# Patient Record
Sex: Male | Born: 1955 | Race: White | Hispanic: No | Marital: Married | Smoking: Former smoker
Health system: Southern US, Community
[De-identification: ages and names within clinical notes are randomized; demographics above are authoritative.]

## PROBLEM LIST (undated history)

## (undated) DIAGNOSIS — Z789 Other specified health status: Secondary | ICD-10-CM

## (undated) HISTORY — PX: NO PAST SURGERIES: SHX2092

## (undated) HISTORY — PX: OTHER SURGICAL HISTORY: SHX169

---

## 2013-03-30 ENCOUNTER — Other Ambulatory Visit: Payer: Self-pay | Admitting: Family Medicine

## 2013-03-30 DIAGNOSIS — R7989 Other specified abnormal findings of blood chemistry: Secondary | ICD-10-CM

## 2013-03-31 ENCOUNTER — Ambulatory Visit
Admission: RE | Admit: 2013-03-31 | Discharge: 2013-03-31 | Disposition: A | Discharge status: Home / Self Care | Payer: PRIVATE HEALTH INSURANCE | Source: Ambulatory Visit | Attending: Family Medicine | Admitting: Family Medicine

## 2013-03-31 DIAGNOSIS — R7989 Other specified abnormal findings of blood chemistry: Secondary | ICD-10-CM

## 2013-12-12 IMAGING — US US ABDOMEN COMPLETE
1 series · 14 of 25 positions shown · non-contrast
Comparison: None.

CLINICAL DATA: Elevated ferritin level

COMPLETE ABDOMINAL ULTRASOUND

[Series 1: us abdomen complete · 0.33mm/px · 14 of 92 slices shown]
[im 1/92]
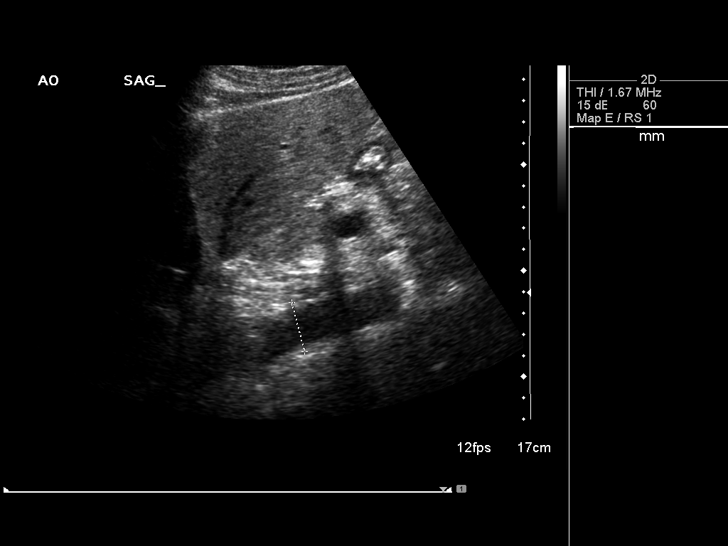
[im 8/92]
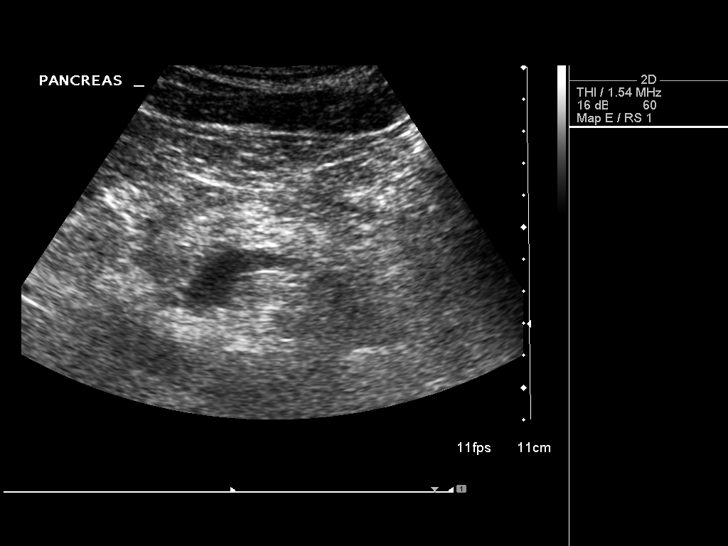
[im 16/92]
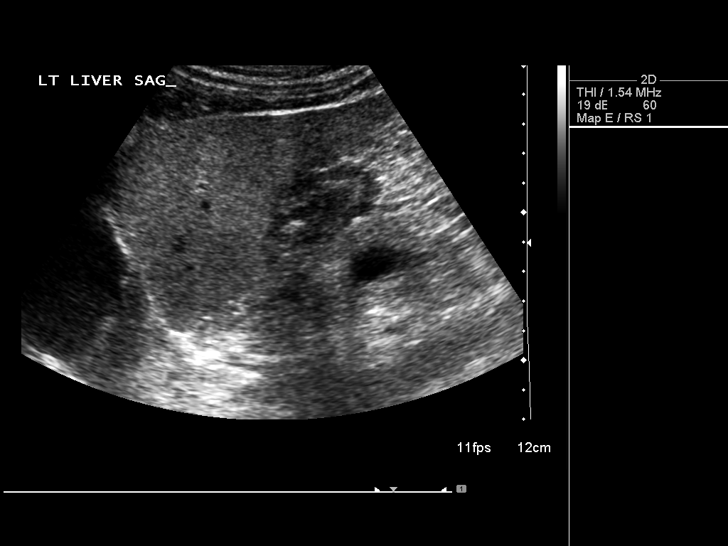
[im 23/92]
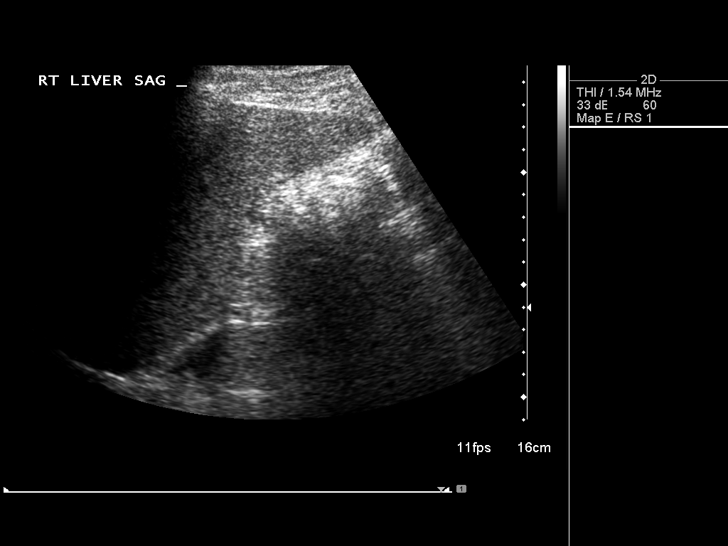
[im 31/92]
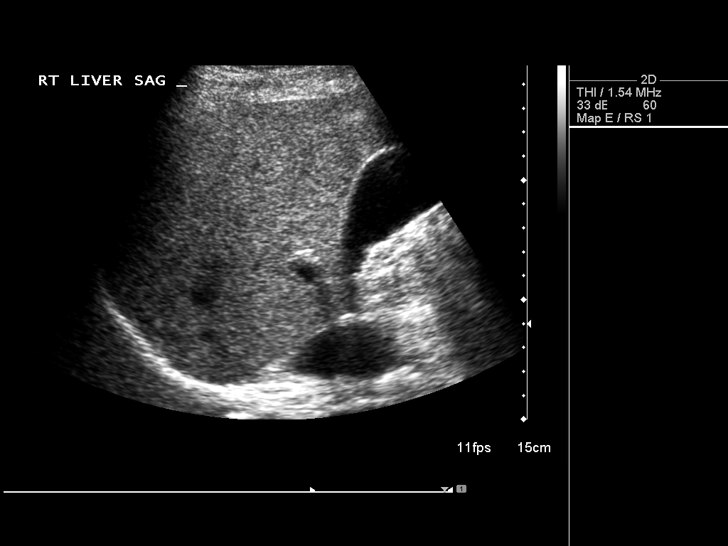
[im 35/92]
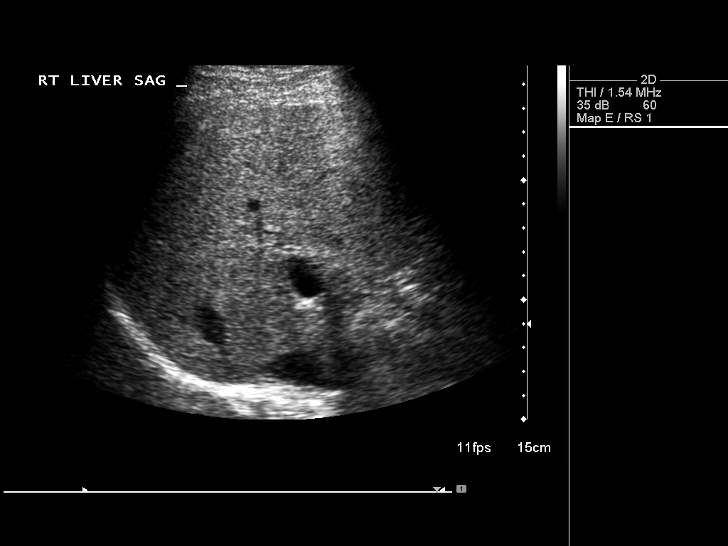
[im 42/92]
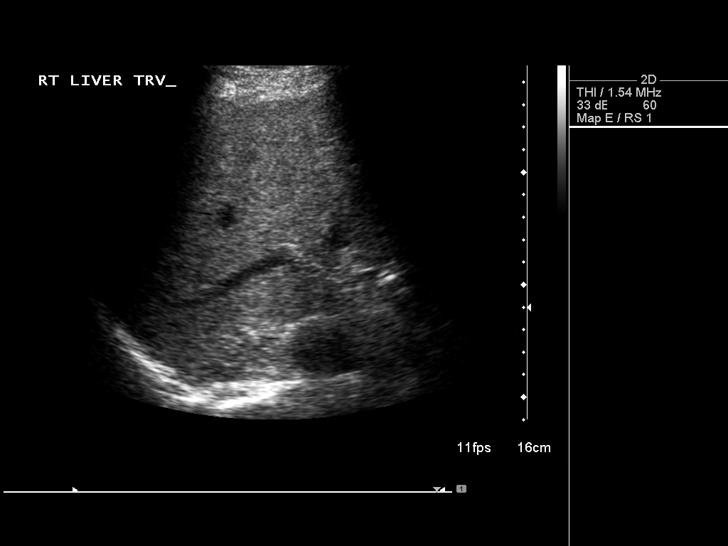
[im 50/92]
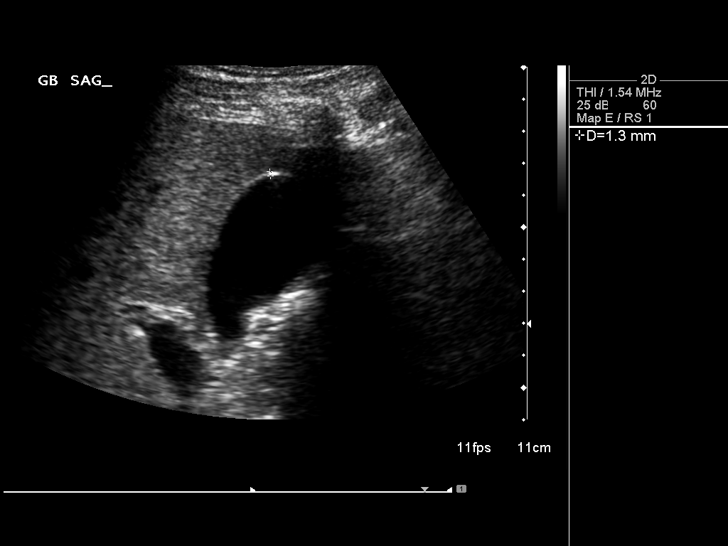
[im 57/92]
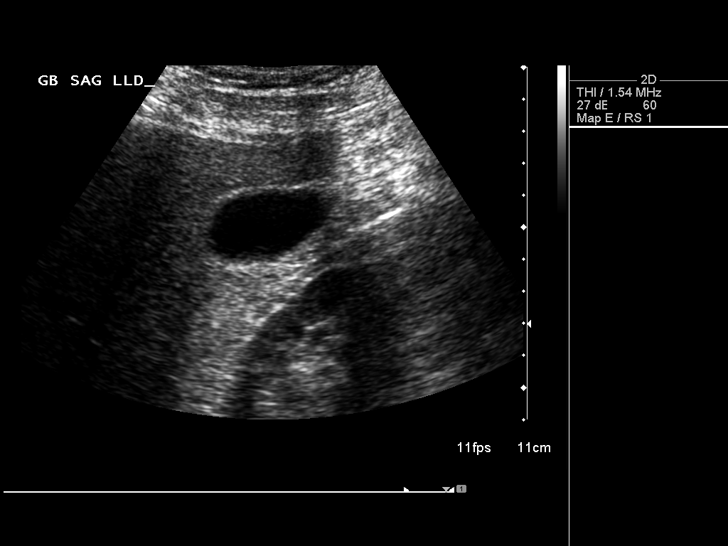
[im 61/92]
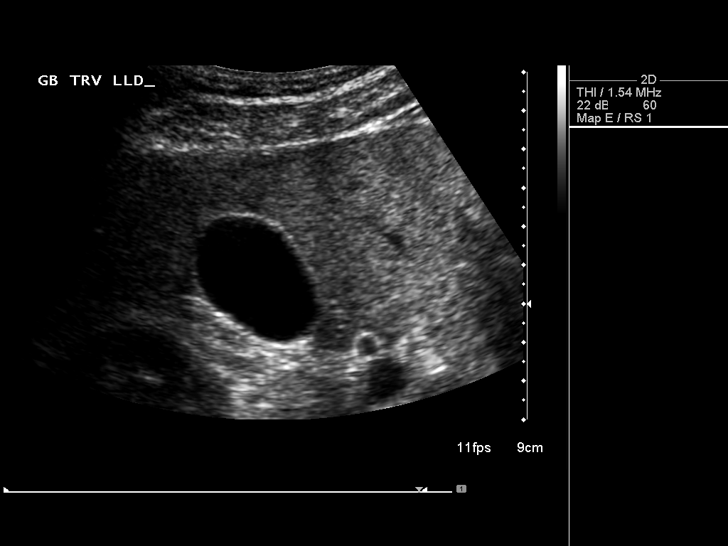
[im 69/92]
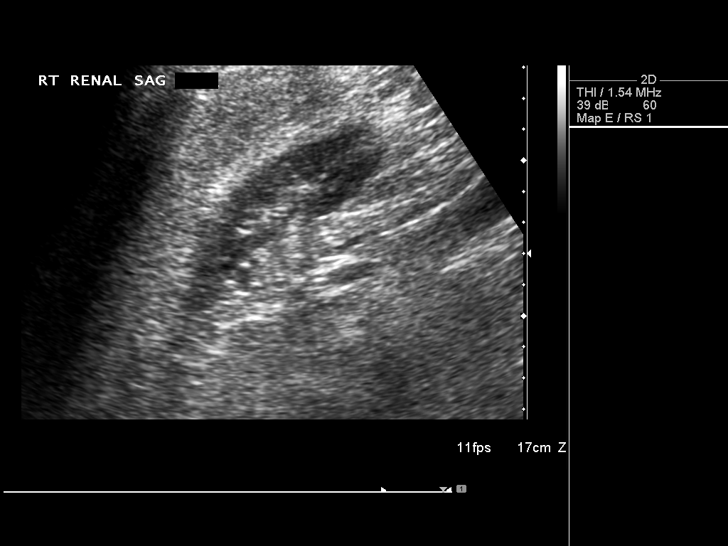
[im 76/92]
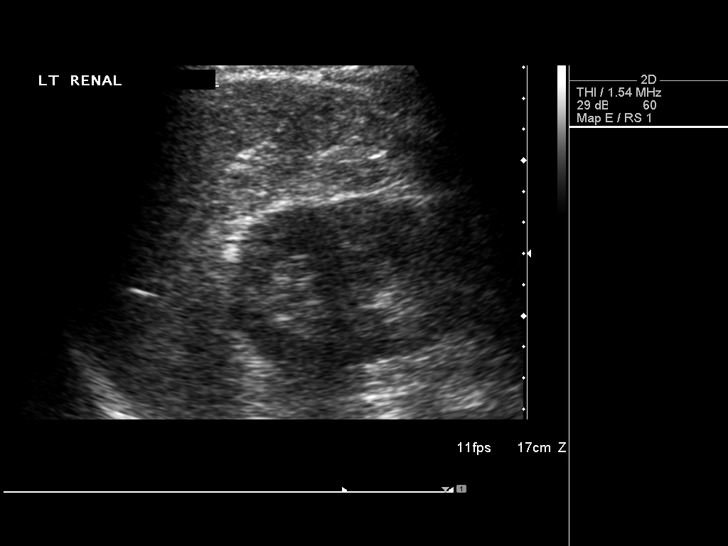
[im 84/92]
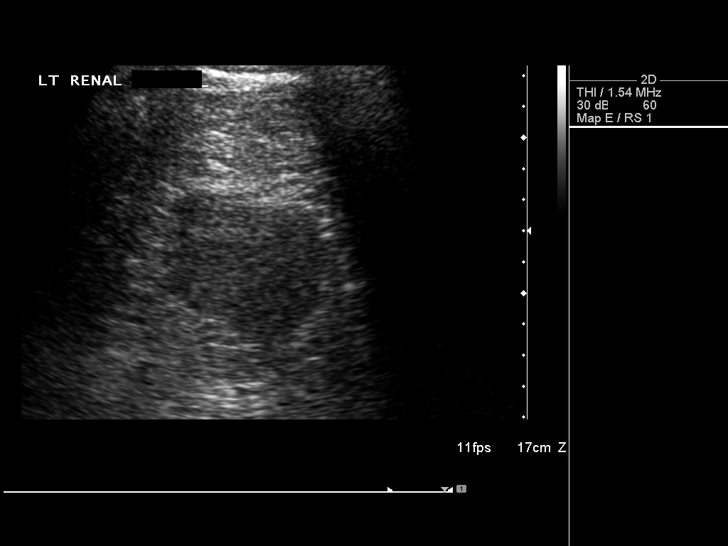
[im 92/92]
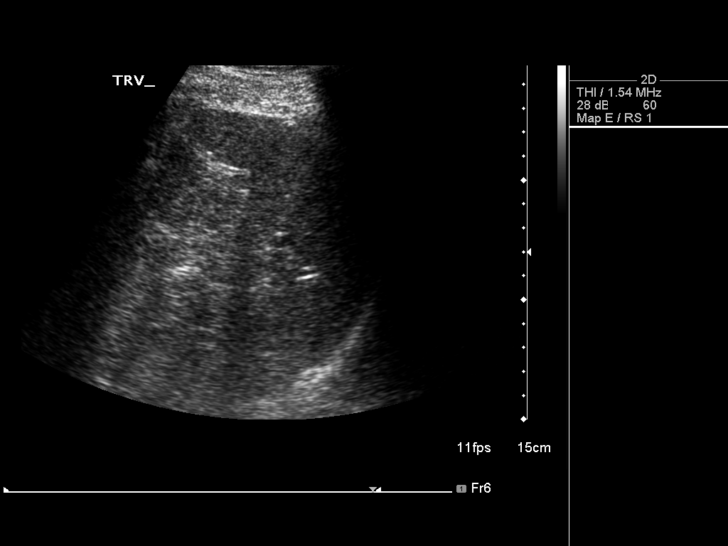

[14 of 25 positions shown; findings below may reference images not displayed]

FINDINGS: Gallbladder:  The gallbladder is visualized and no gallstones are
noted.  There is no pain over the gallbladder with compression.

Common bile duct:  The common bile duct is normal measuring 5.5 mm
in diameter.

Liver:  The liver may be slightly echogenic, which could indicate
fatty infiltration.  Correlation with liver function tests is
recommended.

IVC:  Appears normal.

Pancreas:  No focal abnormality seen.

Spleen:  The spleen is normal measuring 7.9 cm sagittally.

Right Kidney:  No hydronephrosis is seen.  The right kidney
measures 11.8 cm sagittally.

Left Kidney:  No hydronephrosis is noted.  The left kidney measures
12.0 cm.

Abdominal aorta:  The abdominal aorta is normal in caliber.
IMPRESSION: 1.  No gallstones.  No ductal dilatation.
2.  Somewhat echogenic liver parenchyma may indicate fatty
infiltration.  Correlate with liver function tests.

## 2016-10-03 ENCOUNTER — Other Ambulatory Visit: Payer: Self-pay | Admitting: Gastroenterology

## 2016-11-09 ENCOUNTER — Encounter (HOSPITAL_COMMUNITY): Payer: Self-pay | Admitting: *Deleted

## 2016-11-13 ENCOUNTER — Ambulatory Visit (HOSPITAL_COMMUNITY): Payer: BLUE CROSS/BLUE SHIELD | Admitting: Anesthesiology

## 2016-11-13 ENCOUNTER — Ambulatory Visit (HOSPITAL_COMMUNITY)
Admission: RE | Admit: 2016-11-13 | Discharge: 2016-11-13 | Disposition: A | Discharge status: Home / Self Care | Payer: BLUE CROSS/BLUE SHIELD | Source: Ambulatory Visit | Attending: Gastroenterology | Admitting: Gastroenterology

## 2016-11-13 ENCOUNTER — Encounter (HOSPITAL_COMMUNITY)
Admission: RE | Disposition: A | Discharge status: Home / Self Care | Payer: Self-pay | Source: Ambulatory Visit | Attending: Gastroenterology

## 2016-11-13 ENCOUNTER — Encounter (HOSPITAL_COMMUNITY): Payer: Self-pay

## 2016-11-13 DIAGNOSIS — Z1211 Encounter for screening for malignant neoplasm of colon: Secondary | ICD-10-CM | POA: Insufficient documentation

## 2016-11-13 DIAGNOSIS — E7800 Pure hypercholesterolemia, unspecified: Secondary | ICD-10-CM | POA: Diagnosis not present

## 2016-11-13 DIAGNOSIS — D122 Benign neoplasm of ascending colon: Secondary | ICD-10-CM | POA: Diagnosis not present

## 2016-11-13 DIAGNOSIS — Z87891 Personal history of nicotine dependence: Secondary | ICD-10-CM | POA: Insufficient documentation

## 2016-11-13 HISTORY — PX: COLONOSCOPY WITH PROPOFOL: SHX5780

## 2016-11-13 HISTORY — DX: Other specified health status: Z78.9

## 2016-11-13 SURGERY — COLONOSCOPY WITH PROPOFOL
Anesthesia: Monitor Anesthesia Care

## 2016-11-13 MED ORDER — PROPOFOL 10 MG/ML IV BOLUS
INTRAVENOUS | Status: AC
  Filled 2016-11-13: qty 40

## 2016-11-13 MED ORDER — PROPOFOL 500 MG/50ML IV EMUL
INTRAVENOUS | Status: DC | PRN
  Administered 2016-11-13: 150 ug/kg/min via INTRAVENOUS

## 2016-11-13 MED ORDER — LACTATED RINGERS IV SOLN
INTRAVENOUS | Status: DC
  Administered 2016-11-13: 500 mL via INTRAVENOUS

## 2016-11-13 MED ORDER — SODIUM CHLORIDE 0.9 % IV SOLN: INTRAVENOUS | Status: DC

## 2016-11-13 MED ORDER — PROPOFOL 10 MG/ML IV BOLUS
INTRAVENOUS | Status: AC
  Filled 2016-11-13: qty 20

## 2016-11-13 MED ORDER — LIDOCAINE 2% (20 MG/ML) 5 ML SYRINGE
INTRAMUSCULAR | Status: AC
  Filled 2016-11-13: qty 5

## 2016-11-13 MED ORDER — PROPOFOL 10 MG/ML IV BOLUS
INTRAVENOUS | Status: DC | PRN
  Administered 2016-11-13: 50 mg via INTRAVENOUS

## 2016-11-13 MED ORDER — LIDOCAINE 2% (20 MG/ML) 5 ML SYRINGE
INTRAMUSCULAR | Status: DC | PRN
  Administered 2016-11-13: 100 mg via INTRAVENOUS

## 2016-11-13 SURGICAL SUPPLY — 21 items

## 2016-11-13 NOTE — H&P (Signed)
Procedure: Screening colonoscopy. Normal screening colonoscopy was performed on 05/28/2006  History: The patient is a 61 year old male born October 31, 1955. He is scheduled to undergo a repeat screening colonoscopy today. There is no family history of colon cancer.  Past medical history: Hypercholesterolemia.  Exam: The patient is alert and lying comfortably on the endoscopy stretcher. Abdomen soft and nontender to palpation. Cardiac exam reveals a regular rhythm. Lungs appear to auscultation.  Plan: Proceed with screening colonoscopy

## 2016-11-13 NOTE — Op Note (Signed)
[facility] Patient Name: Alan Taylor Procedure Date: 11/13/2016 MRN: [id number] Attending MD: Garlan Fair , MD Date of Birth: Jan 15, 1956 CSN: [id number] Age: 61 Admit Type: Outpatient Procedure:                Colonoscopy Indications:              Screening for colorectal malignant neoplasm Providers:                Garlan Fair, MD, Hilma Favors, RN, Elspeth Cho Tech., Technician, [facility],                            CRNA Referring MD:              Medicines:                Propofol per Anesthesia Complications:            No immediate complications. Estimated Blood Loss:     Estimated blood loss was minimal. Procedure:                Pre-Anesthesia Assessment:                           - Prior to the procedure, a History and Physical                            was performed, and patient medications and                            allergies were reviewed. The patient's tolerance of                            previous anesthesia was also reviewed. The risks                            and benefits of the procedure and the sedation                            options and risks were discussed with the patient.                            All questions were answered, and informed consent                            was obtained. Prior Anticoagulants: The patient has                            taken aspirin, last dose was day of procedure. ASA                            Grade Assessment: II - A patient with mild systemic  disease. After reviewing the risks and benefits,                            the patient was deemed in satisfactory condition to                            undergo the procedure.                           After obtaining informed consent, the colonoscope                            was passed under direct vision. Throughout the                            procedure, the patient's  blood pressure, pulse, and                            oxygen saturations were monitored continuously. The                            EC-[id number] ([id number]) scope was introduced through                            the anus and advanced to the the cecum, identified                            by appendiceal orifice and ileocecal valve. The                            colonoscopy was performed without difficulty. The                            patient tolerated the procedure well. The quality                            of the bowel preparation was good. The terminal                            ileum, the ileocecal valve, the appendiceal orifice                            and the rectum were photographed. Scope In: 10:01:17 AM Scope Out: 10:20:49 AM Scope Withdrawal Time: 0 hours 13 minutes 7 seconds  Total Procedure Duration: 0 hours 19 minutes 32 seconds  Findings:      The perianal and digital rectal examinations were normal.      A 3 mm polyp was found in the proximal ascending colon. The polyp was       sessile. The polyp was removed with a cold biopsy forceps. Resection and       retrieval were complete.      The exam was otherwise without abnormality. Impression:               - One 3 mm polyp in the proximal ascending  colon,                            removed with a cold biopsy forceps. Resected and                            retrieved.                           - The examination was otherwise normal. Moderate Sedation:      N/A- Per Anesthesia Care Recommendation:           - Patient has a contact number available for                            emergencies. The signs and symptoms of potential                            delayed complications were discussed with the                            patient. Return to normal activities tomorrow.                            Written discharge instructions were provided to the                            patient.                           - Repeat  colonoscopy date to be determined after                            pending pathology results are reviewed for                            surveillance.                           - Resume previous diet.                           - Continue present medications. Procedure Code(s):        --- Professional ---                           [phone], Colonoscopy, flexible; with biopsy, single                            or multiple Diagnosis Code(s):        --- Professional ---                           Z12.11, Encounter for screening for malignant                            neoplasm of colon  D12.2, Benign neoplasm of ascending colon CPT copyright 2016 [employer]. All rights reserved. The codes documented in this report are preliminary and upon coder review may  be revised to meet current compliance requirements. Earle Gell, MD Garlan Fair, MD 11/13/2016 10:26:27 AM This report has been signed electronically. Number of Addenda: 0

## 2016-11-13 NOTE — Discharge Instructions (Signed)

## 2016-11-13 NOTE — Anesthesia Postprocedure Evaluation (Signed)
Anesthesia Post Note  Patient: Alan Taylor  Procedure(s) Performed: Procedure(s) (LRB): COLONOSCOPY WITH PROPOFOL (N/A)  Patient location during evaluation: PACU Anesthesia Type: MAC Level of consciousness: awake and alert Pain management: pain level controlled Vital Signs Assessment: post-procedure vital signs reviewed and stable Respiratory status: spontaneous breathing, nonlabored ventilation, respiratory function stable and patient connected to nasal cannula oxygen Cardiovascular status: stable and blood pressure returned to baseline Anesthetic complications: no       Last Vitals:  Vitals:   11/13/16 1040 11/13/16 1050  BP: (!) 146/72 (!) 179/96  Pulse: 61 (!) 55  Resp: (!) 21 16  Temp:      Last Pain:  Vitals:   11/13/16 1026  TempSrc: Oral                 Mairim Bade EDWARD

## 2016-11-13 NOTE — Anesthesia Preprocedure Evaluation (Signed)
Anesthesia Evaluation  Patient identified by MRN, date of birth, ID band Patient awake    Reviewed: Allergy & Precautions, H&P , Patient's Chart, lab work & pertinent test results, reviewed documented beta blocker date and time   Airway Mallampati: II  TM Distance: >3 FB Neck ROM: full    Dental no notable dental hx.    Pulmonary former smoker,    Pulmonary exam normal breath sounds clear to auscultation       Cardiovascular  Rhythm:regular Rate:Normal     Neuro/Psych    GI/Hepatic   Endo/Other    Renal/GU      Musculoskeletal   Abdominal   Peds  Hematology   Anesthesia Other Findings BP: 219/91 ... Will discuss with Dr Wynetta Emery for FU and poss Rx  Reproductive/Obstetrics                             Anesthesia Physical Anesthesia Plan  ASA: II  Anesthesia Plan: MAC   Post-op Pain Management:    Induction: Intravenous  Airway Management Planned: Mask and Natural Airway  Additional Equipment:   Intra-op Plan:   Post-operative Plan:   Informed Consent: I have reviewed the patients History and Physical, chart, labs and discussed the procedure including the risks, benefits and alternatives for the proposed anesthesia with the patient or authorized representative who has indicated his/her understanding and acceptance.   Dental Advisory Given  Plan Discussed with: CRNA and Surgeon  Anesthesia Plan Comments: (Discussed sedation and potential to need to place airway or ETT if warranted by clinical changes intra-operatively. We will start procedure as MAC.)        Anesthesia Quick Evaluation

## 2016-11-13 NOTE — Transfer of Care (Signed)
Immediate Anesthesia Transfer of Care Note  Patient: Alan Taylor  Procedure(s) Performed: Procedure(s): COLONOSCOPY WITH PROPOFOL (N/A)  Patient Location: PACU  Anesthesia Type:MAC  Level of Consciousness: awake, alert  and oriented  Airway & Oxygen Therapy: Patient Spontanous Breathing and Patient connected to face mask oxygen  Post-op Assessment: Report given to RN and Post -op Vital signs reviewed and stable  Post vital signs: Reviewed and stable  Last Vitals:  Vitals:   11/13/16 0820  BP: (!) 219/91  Pulse: (!) 59  Resp: 18  Temp: 36.6 C    Last Pain:  Vitals:   11/13/16 0820  TempSrc: Oral         Complications: No apparent anesthesia complications

## 2016-11-14 ENCOUNTER — Encounter (HOSPITAL_COMMUNITY): Payer: Self-pay | Admitting: Gastroenterology

## 2017-03-15 NOTE — Anesthesia Postprocedure Evaluation (Signed)
Anesthesia Post Note  Patient: Alan Taylor  Procedure(s) Performed: Procedure(s) (LRB): COLONOSCOPY WITH PROPOFOL (N/A)     Anesthesia Post Evaluation  Last Vitals:  Vitals:   11/13/16 1040 11/13/16 1050  BP: (!) 146/72 (!) 179/96  Pulse: 61 (!) 55  Resp: (!) 21 16  Temp:      Last Pain:  Vitals:   11/13/16 1026  TempSrc: Oral                 Calton Harshfield EDWARD

## 2017-03-15 NOTE — Addendum Note (Signed)
Addendum  created 03/15/17 1442 by Lyndle Herrlich, MD   Sign clinical note
